# Patient Record
Sex: Male | Born: 1950 | Race: White | Hispanic: No | Marital: Single
Health system: Southern US, Community
[De-identification: ages and names within clinical notes are randomized; demographics above are authoritative.]

## PROBLEM LIST (undated history)

## (undated) DIAGNOSIS — E119 Type 2 diabetes mellitus without complications: Secondary | ICD-10-CM

## (undated) DIAGNOSIS — E785 Hyperlipidemia, unspecified: Secondary | ICD-10-CM

## (undated) DIAGNOSIS — F339 Major depressive disorder, recurrent, unspecified: Secondary | ICD-10-CM

## (undated) DIAGNOSIS — I340 Nonrheumatic mitral (valve) insufficiency: Secondary | ICD-10-CM

## (undated) DIAGNOSIS — F321 Major depressive disorder, single episode, moderate: Secondary | ICD-10-CM

## (undated) DIAGNOSIS — I10 Essential (primary) hypertension: Secondary | ICD-10-CM

## (undated) DIAGNOSIS — Z8669 Personal history of other diseases of the nervous system and sense organs: Secondary | ICD-10-CM

## (undated) DIAGNOSIS — R002 Palpitations: Secondary | ICD-10-CM

## (undated) DIAGNOSIS — I509 Heart failure, unspecified: Secondary | ICD-10-CM

## (undated) DIAGNOSIS — J45909 Unspecified asthma, uncomplicated: Secondary | ICD-10-CM

## (undated) DIAGNOSIS — I2510 Atherosclerotic heart disease of native coronary artery without angina pectoris: Secondary | ICD-10-CM

## (undated) HISTORY — PX: HEMORRHOID SURGERY: SHX153

## (undated) HISTORY — DX: Palpitations: R00.2

## (undated) HISTORY — DX: Essential (primary) hypertension: I10

## (undated) HISTORY — DX: Type 2 diabetes mellitus without complications: E11.9

## (undated) HISTORY — DX: Hyperlipidemia, unspecified: E78.5

## (undated) HISTORY — DX: Major depressive disorder, recurrent, unspecified: F33.9

## (undated) HISTORY — PX: CHOLECYSTECTOMY: SHX55

## (undated) HISTORY — DX: Personal history of other diseases of the nervous system and sense organs: Z86.69

## (undated) HISTORY — DX: Nonrheumatic mitral (valve) insufficiency: I34.0

## (undated) HISTORY — DX: Unspecified asthma, uncomplicated: J45.909

## (undated) HISTORY — DX: Atherosclerotic heart disease of native coronary artery without angina pectoris: I25.10

## (undated) HISTORY — PX: TONSILLECTOMY AND ADENOIDECTOMY: SHX28

## (undated) HISTORY — DX: Heart failure, unspecified: I50.9

## (undated) HISTORY — DX: Major depressive disorder, single episode, moderate: F32.1

## (undated) HISTORY — PX: GALLBLADDER SURGERY: SHX652

## (undated) HISTORY — PX: OTHER SURGICAL HISTORY: SHX169

---

## 2008-06-10 ENCOUNTER — Ambulatory Visit: Payer: Self-pay | Admitting: Nurse Practitioner

## 2009-02-24 ENCOUNTER — Emergency Department: Payer: Self-pay | Admitting: Emergency Medicine

## 2009-09-22 ENCOUNTER — Ambulatory Visit: Payer: Self-pay | Admitting: Adult Health

## 2009-09-24 ENCOUNTER — Emergency Department: Payer: Self-pay | Admitting: Emergency Medicine

## 2009-10-11 ENCOUNTER — Emergency Department: Payer: Self-pay | Admitting: Emergency Medicine

## 2009-12-03 ENCOUNTER — Emergency Department: Payer: Self-pay | Admitting: Emergency Medicine

## 2010-02-05 ENCOUNTER — Emergency Department: Payer: Self-pay | Admitting: Emergency Medicine

## 2010-12-25 ENCOUNTER — Emergency Department: Payer: Self-pay | Admitting: Emergency Medicine

## 2011-07-15 ENCOUNTER — Ambulatory Visit: Payer: Self-pay | Admitting: Cardiovascular Disease

## 2011-08-17 ENCOUNTER — Inpatient Hospital Stay: Payer: Self-pay | Admitting: Internal Medicine

## 2011-09-20 ENCOUNTER — Other Ambulatory Visit: Payer: Self-pay | Admitting: Physician Assistant

## 2011-10-14 ENCOUNTER — Emergency Department: Payer: Self-pay | Admitting: Emergency Medicine

## 2011-11-24 ENCOUNTER — Ambulatory Visit: Payer: Self-pay | Admitting: Gastroenterology

## 2012-01-04 IMAGING — CR DG ABDOMEN 1V
1 series · 4 of 4 positions shown · non-contrast
Comparison: none

REASON FOR EXAM: need lateral too stuck insulin needle into abd and broke
it off into skin  eval
COMMENTS:

[Series 1: w abdomen decub · 0.14mm/px · 4 of 4 slices shown]
[im 1/4]
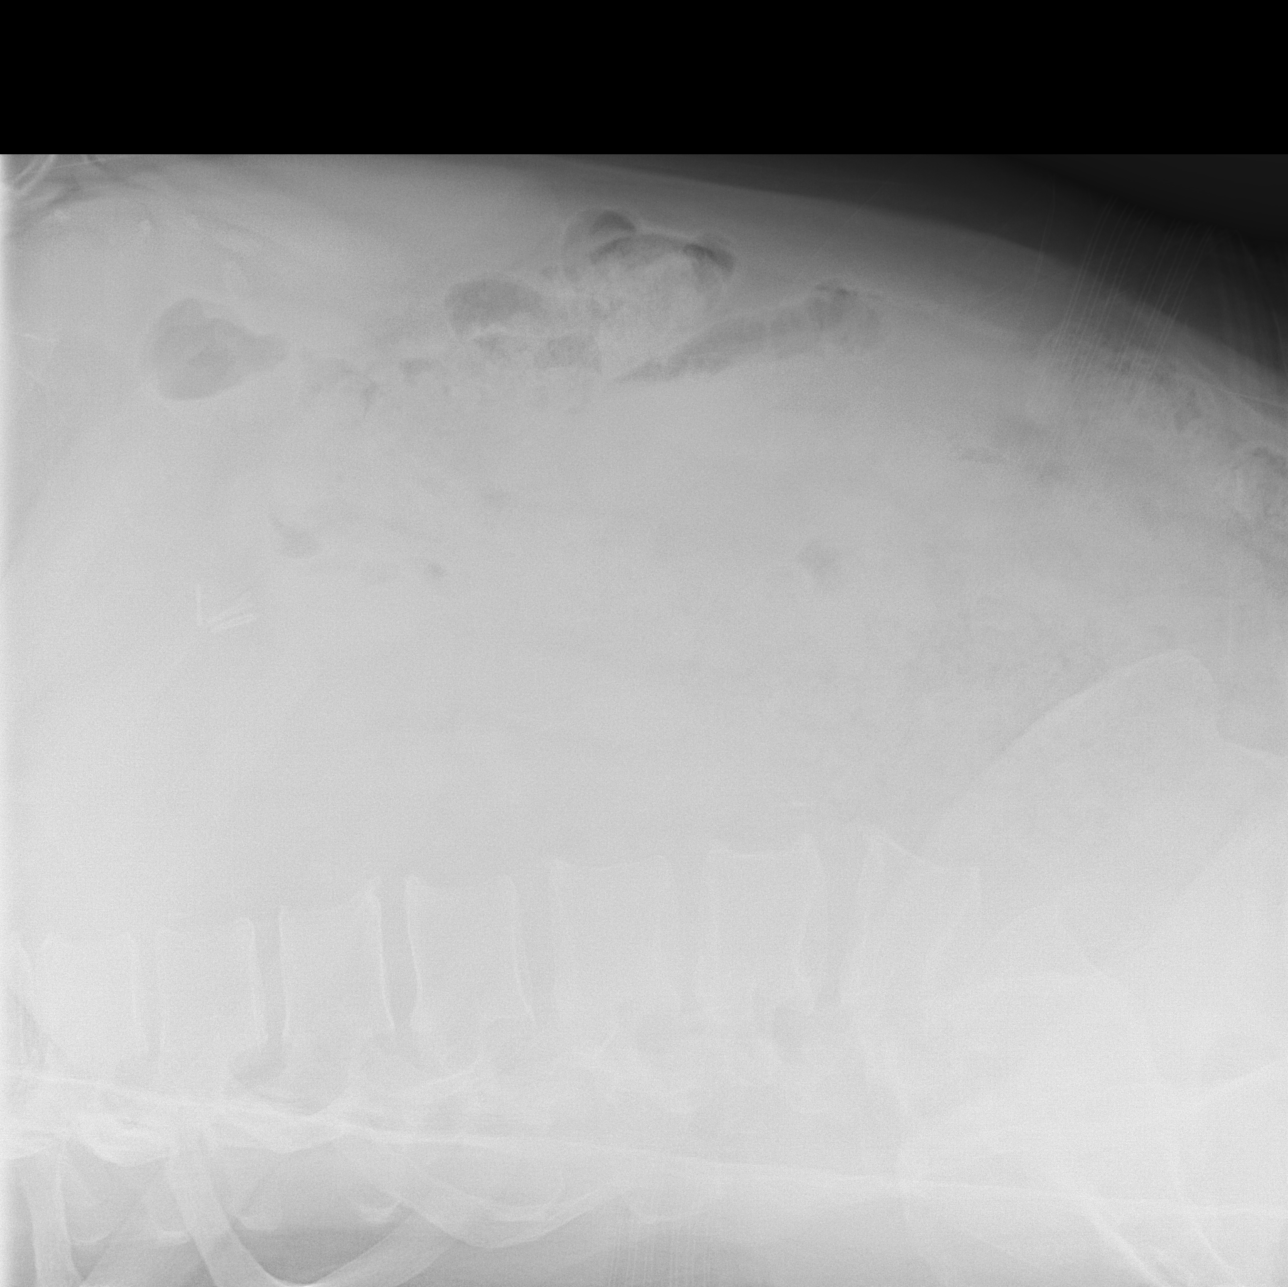
[im 2/4]
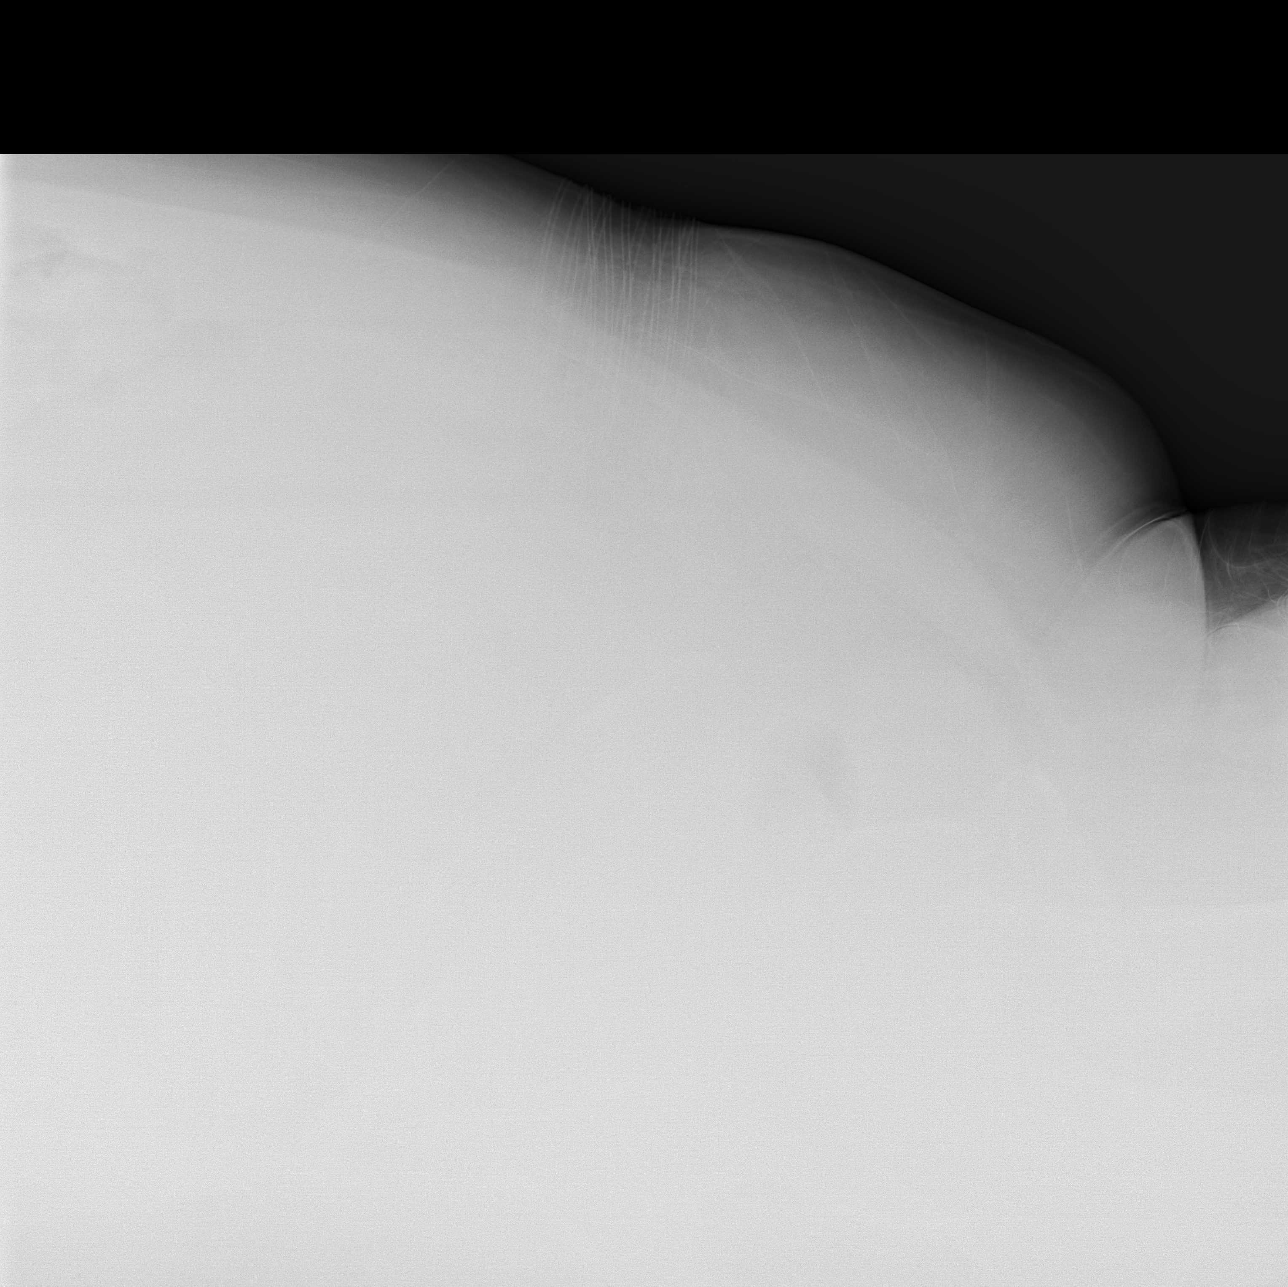
[im 3/4]
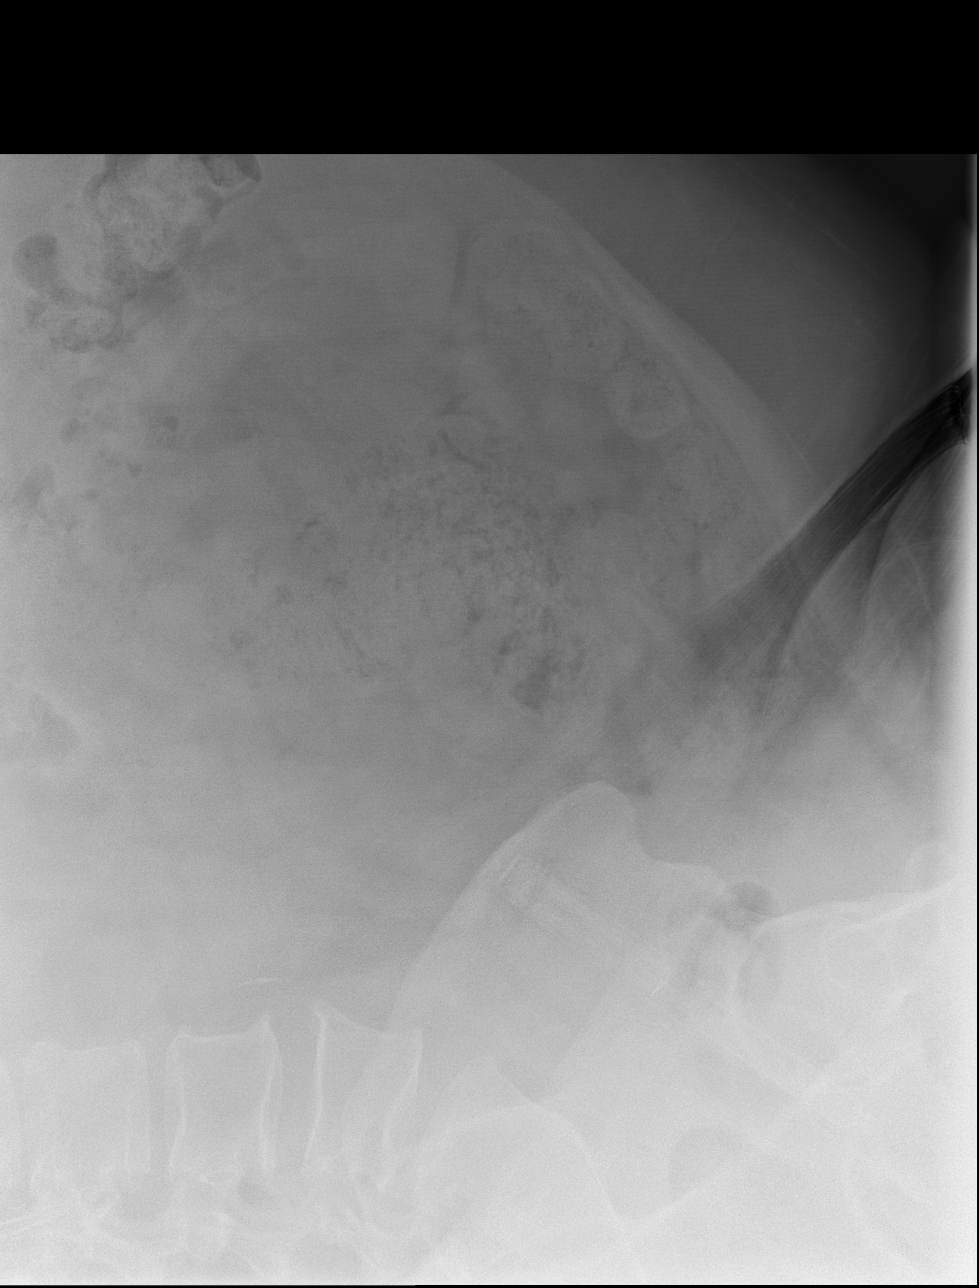
[im 4/4]
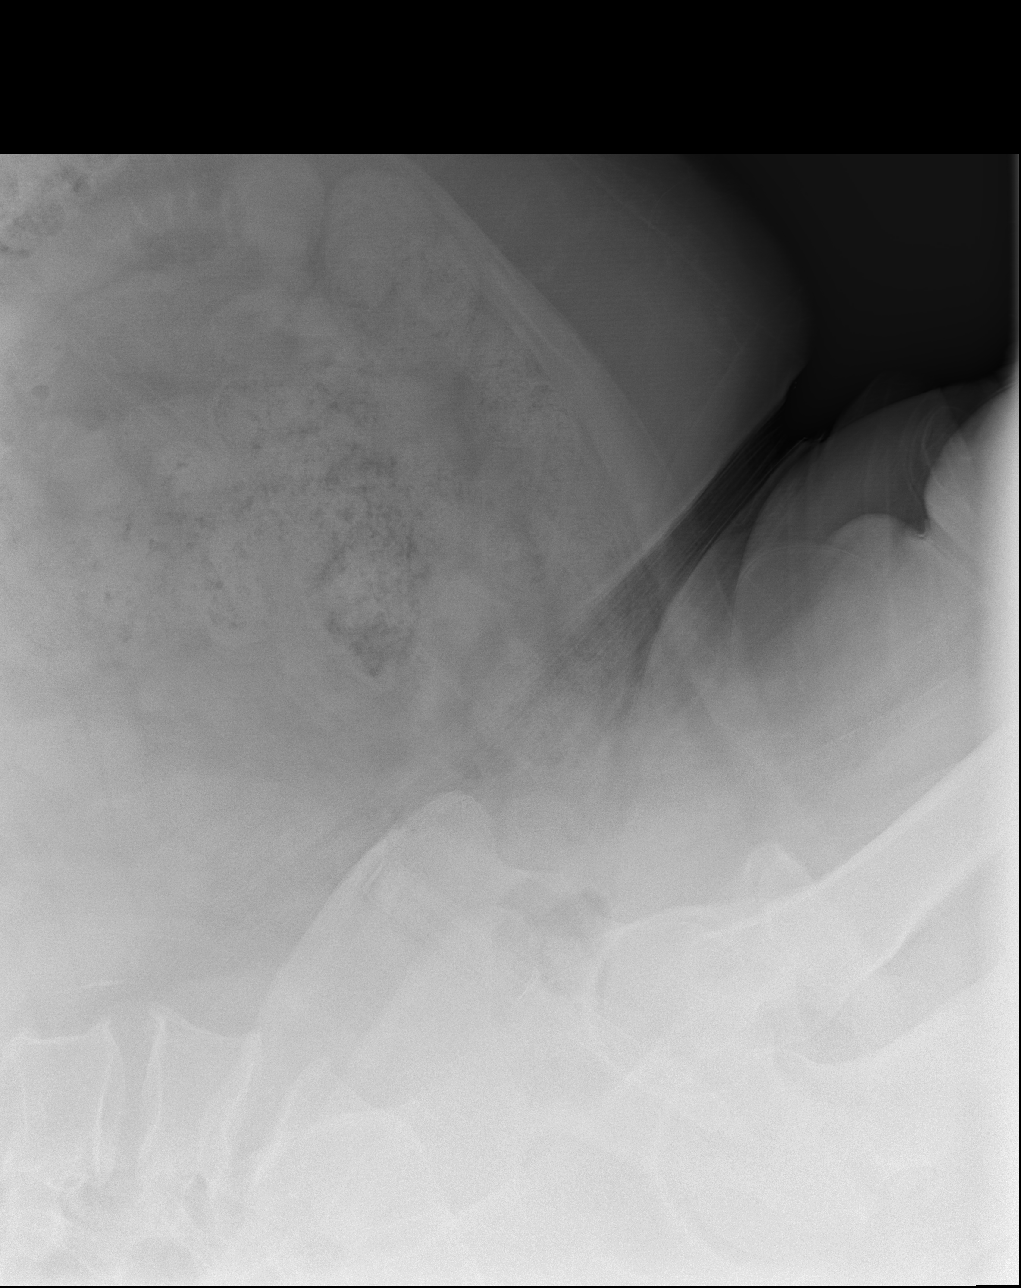

[4 of 4 positions shown; findings below may reference images not displayed]

PROCEDURE:     DXR - DXR ABDOMEN AP ONLY  - October 14, 2011  [DATE]

RESULT:     Crosstable lateral views with soft tissue technique were
obtained. No radiodense soft tissue foreign body is seen. Reportedly, a
portion of an insulin syringe needle broke but on the current exam no soft
tissue foreign body is identified.
IMPRESSION: No soft tissue foreign body is identified.

## 2012-03-29 ENCOUNTER — Ambulatory Visit: Payer: Self-pay | Admitting: Gastroenterology

## 2012-11-02 ENCOUNTER — Ambulatory Visit: Payer: Self-pay | Admitting: Ophthalmology

## 2012-11-02 DIAGNOSIS — I10 Essential (primary) hypertension: Secondary | ICD-10-CM

## 2012-11-02 LAB — CBC WITH DIFFERENTIAL/PLATELET
Basophil #: 0.1 x10 3/mm 3
Basophil %: 1.1 %
Eosinophil #: 0.3 x10 3/mm 3
Eosinophil %: 4.6 %
HCT: 41.6 %
HGB: 14.5 g/dL
Lymphocyte %: 21.8 %
Lymphs Abs: 1.5 x10 3/mm 3
MCH: 27.6 pg
MCHC: 34.8 g/dL
MCV: 79 fL — ABNORMAL LOW
Monocyte #: 0.5 "x10 3/mm "
Monocyte %: 6.7 %
Neutrophil #: 4.6 x10 3/mm 3
Neutrophil %: 65.8 %
Platelet: 187 x10 3/mm 3
RBC: 5.26 x10 6/mm 3
RDW: 14.7 % — ABNORMAL HIGH
WBC: 7 x10 3/mm 3

## 2012-11-02 LAB — BASIC METABOLIC PANEL
Anion Gap: 6 — ABNORMAL LOW (ref 7–16)
BUN: 17 mg/dL (ref 7–18)
Co2: 30 mmol/L (ref 21–32)
Creatinine: 0.93 mg/dL (ref 0.60–1.30)
EGFR (African American): >60
Osmolality: 285 (ref 275–301)

## 2012-11-15 ENCOUNTER — Ambulatory Visit: Payer: Self-pay | Admitting: Ophthalmology

## 2013-03-09 ENCOUNTER — Other Ambulatory Visit: Payer: Self-pay | Admitting: Ophthalmology

## 2013-03-09 LAB — POTASSIUM: Potassium: 4.6 mmol/L (ref 3.5–5.1)

## 2013-03-14 ENCOUNTER — Ambulatory Visit: Payer: Self-pay | Admitting: Ophthalmology

## 2013-03-29 ENCOUNTER — Ambulatory Visit: Payer: Self-pay | Admitting: Cardiovascular Disease

## 2014-03-27 ENCOUNTER — Ambulatory Visit: Payer: Self-pay | Admitting: Ophthalmology

## 2014-03-27 DIAGNOSIS — I2510 Atherosclerotic heart disease of native coronary artery without angina pectoris: Secondary | ICD-10-CM

## 2014-03-27 LAB — BASIC METABOLIC PANEL
Anion Gap: 5 — ABNORMAL LOW (ref 7–16)
BUN: 27 mg/dL — AB (ref 7–18)
CHLORIDE: 98 mmol/L (ref 98–107)
CO2: 30 mmol/L (ref 21–32)
Calcium, Total: 9.3 mg/dL (ref 8.5–10.1)
Creatinine: 1.53 mg/dL — ABNORMAL HIGH (ref 0.60–1.30)
EGFR (Non-African Amer.): 47 — ABNORMAL LOW
GFR CALC AF AMER: 55 — AB
GLUCOSE: 358 mg/dL — AB (ref 65–99)
Osmolality: 286 (ref 275–301)
POTASSIUM: 5.1 mmol/L (ref 3.5–5.1)
SODIUM: 133 mmol/L — AB (ref 136–145)

## 2014-03-27 LAB — CBC WITH DIFFERENTIAL/PLATELET
Basophil #: 0.1 10*3/uL (ref 0.0–0.1)
Basophil %: 0.7 %
EOS ABS: 0.2 10*3/uL (ref 0.0–0.7)
Eosinophil %: 1.4 %
HCT: 45.8 % (ref 40.0–52.0)
HGB: 15.1 g/dL (ref 13.0–18.0)
LYMPHS ABS: 1.9 10*3/uL (ref 1.0–3.6)
LYMPHS PCT: 17.2 %
MCH: 27.4 pg (ref 26.0–34.0)
MCHC: 32.9 g/dL (ref 32.0–36.0)
MCV: 83 fL (ref 80–100)
MONOS PCT: 7.1 %
Monocyte #: 0.8 x10 3/mm (ref 0.2–1.0)
NEUTROS ABS: 8.1 10*3/uL — AB (ref 1.4–6.5)
Neutrophil %: 73.6 %
Platelet: 183 10*3/uL (ref 150–440)
RBC: 5.5 10*6/uL (ref 4.40–5.90)
RDW: 15.7 % — ABNORMAL HIGH (ref 11.5–14.5)
WBC: 11 10*3/uL — ABNORMAL HIGH (ref 3.8–10.6)

## 2014-09-25 ENCOUNTER — Ambulatory Visit: Payer: Self-pay | Admitting: Ophthalmology

## 2014-09-25 LAB — BASIC METABOLIC PANEL
ANION GAP: 5 — AB (ref 7–16)
BUN: 22 mg/dL — AB (ref 7–18)
CALCIUM: 9 mg/dL (ref 8.5–10.1)
CO2: 29 mmol/L (ref 21–32)
CREATININE: 1.14 mg/dL (ref 0.60–1.30)
Chloride: 107 mmol/L (ref 98–107)
EGFR (African American): >60
EGFR (Non-African Amer.): >60
Glucose: 134 mg/dL — ABNORMAL HIGH (ref 65–99)
Osmolality: 287 (ref 275–301)
POTASSIUM: 4.6 mmol/L (ref 3.5–5.1)
Sodium: 141 mmol/L (ref 136–145)

## 2014-09-25 LAB — CBC WITH DIFFERENTIAL/PLATELET
Basophil #: 0.1 10*3/uL (ref 0.0–0.1)
Basophil %: 0.6 %
Eosinophil #: 0.1 10*3/uL (ref 0.0–0.7)
Eosinophil %: 1.4 %
HCT: 39.2 % — ABNORMAL LOW (ref 40.0–52.0)
HGB: 12.9 g/dL — ABNORMAL LOW (ref 13.0–18.0)
LYMPHS ABS: 2.1 10*3/uL (ref 1.0–3.6)
LYMPHS PCT: 22.2 %
MCH: 26.7 pg (ref 26.0–34.0)
MCHC: 32.8 g/dL (ref 32.0–36.0)
MCV: 81 fL (ref 80–100)
Monocyte #: 0.6 x10 3/mm (ref 0.2–1.0)
Monocyte %: 6.4 %
Neutrophil #: 6.5 10*3/uL (ref 1.4–6.5)
Neutrophil %: 69.4 %
Platelet: 197 10*3/uL (ref 150–440)
RBC: 4.82 10*6/uL (ref 4.40–5.90)
RDW: 15 % — ABNORMAL HIGH (ref 11.5–14.5)
WBC: 9.4 10*3/uL (ref 3.8–10.6)

## 2014-10-02 ENCOUNTER — Ambulatory Visit: Payer: Self-pay | Admitting: Ophthalmology

## 2015-04-01 DIAGNOSIS — F321 Major depressive disorder, single episode, moderate: Secondary | ICD-10-CM | POA: Insufficient documentation

## 2015-04-01 DIAGNOSIS — E785 Hyperlipidemia, unspecified: Secondary | ICD-10-CM | POA: Insufficient documentation

## 2015-04-01 DIAGNOSIS — F339 Major depressive disorder, recurrent, unspecified: Secondary | ICD-10-CM | POA: Insufficient documentation

## 2015-04-02 ENCOUNTER — Other Ambulatory Visit: Payer: Self-pay

## 2015-04-15 NOTE — Op Note (Signed)
PATIENT NAME:  Juan Nicholson, Juan Nicholson MR#:  [id number] DATE OF BIRTH:  [birth year]  DATE OF PROCEDURE:  11/15/2012  PROCEDURES PERFORMED:  1. Pars plana vitrectomy of the right eye.  2. Tractional retinal detachment repair of the right eye.  3. Endolaser, right eye.  4. Silicone oil injection, right eye.  5. Phacoemulsification intraocular lens insertion, right eye.   PREOPERATIVE DIAGNOSES:  1. Tractional retinal detachment.  2. Proliferative diabetic retinopathy.  3. Visually significant cataract.   POSTOPERATIVE DIAGNOSES: 1. Tractional retinal detachment.  2. Proliferative diabetic retinopathy.  3. Visually significant cataract.   ESTIMATED BLOOD LOSS: Less than 1 mL.  PRIMARY SURGEON: Ignacia Felling. Lavaughn Haberle, MD   ANESTHESIA: General endotracheal anesthesia with a supplemental retrobulbar block of the right eye.   COMPLICATIONS: None.   INDICATIONS FOR PROCEDURE: This is a patient who presented to my office with loss of vision in his right eye over several months. Examination revealed proliferative diabetic retinopathy that was associated with a tractional retinal detachment that encompassed approximately 200 to 240 degrees of the retina out to the equator. This also included a retinal detachment of the macula. Risks, benefits, and alternatives of the above procedure were discussed and the patient wished to proceed.   DETAILS OF PROCEDURE: After informed consent was obtained, the patient was brought into the operative suite at [facility]. The patient was placed in supine position, was induced by the anesthesia team without any complications. The right eye was then blocked with a retrobulbar block by the primary surgeon without any complications. The patient was positioned and right eye was prepped and draped in sterile manner. After lid speculum was inserted, a side-port wound was created at approximately 10:30 in the cornea in a clear corneal fashion. DisCoVisc was  injected into the anterior chamber in order to maintain it. Main corneal wound was created at 12 o'clock with a keratome blade. Cystotome was introduced in the eye and a continuous 360 degree anterior capsulorrhexis was created without complication. The lens was hydrodissected using BSS and rotated for 90 degrees. Phacoemulsification wand was introduced in the eye and the lens was broken into four quadrants and removed without complication. The cortical material was removed using INA. DisCoVisc was injected into the capsular bag. A 22.5-diopter Tecnis ZCB00 lens, serial #[id number], was injected into the capsular bag and rotated into position. The DisCoVisc was removed using INA. A 10-0 stitch was placed in the main corneal wound and the knot was rotated into the cornea. The side-port wound was hydrated using BSS and the wounds were noted to be watertight. Attention was turned to the pars plana vitrectomy portion of the case.   A 23-gauge trocar was then placed inferotemporally through displaced conjunctiva in an oblique fashion 3 mm beyond the limbus. The infusion cannula was turned on and inserted through the trocar and secured in position with Steri-Strips. Two more trocars were placed in a similar fashion superotemporally and superonasally. The vitreous cutter and light pipe were introduced into the eye and a core vitrectomy was performed. A series of forceps, vitreous cutter, and vertical scissors with pick were utilized in order to carefully dissect each of the proliferative membranes away from each other. Once this was completed, the membranes were removed as much as safely possible. The peripheral vitreous was then peeled away from the retina with no resultant breaks at any point. The retina was confirmed to be truly detached for approximately 250 degrees mostly nasal. The vitreous trimmed out to the  ora serrata for 360 degrees. A posterior draining retinotomy was created at approximately 2:30. An  air-fluid exchange was performed through this draining retinotomy and the retina noted flatten up to the arcades. Endolaser was introduced. Complete panretinal photocoagulation was taken from the arcades out to the ora serrata. The soft tip backflush was reintroduced and remnant fluid was removed from the posterior of the eye and removed through the draining retinotomy. Five rows of laser was placed around the draining retinotomy without complication. A small inferior PI was created using the vitreous cutter. 5000 centistoke silicone oil was then injected into the vitreous cavity up to the level of the posterior chamber IOL. Pressure in the eye was approximately 10 mmHg and the trocars were removed. The wounds were closed using transconjunctival 6-0 plain gut. 5 mg of dexamethasone was given into the inferior fornix. The eye was then cleaned and Cosopt and TobraDex were placed on the eye. A patch and shield was placed over the eye and the patient was taken to postanesthesia care with instructions to remain right side down.   ____________________________ Ignacia FellingMatthew F. Champ MungoAppenzeller, MD mfa:drc D: 11/15/2012 09:50:00 ET T: 11/15/2012 12:18:56 ET JOB#: [id number]  cc: Ignacia FellingMatthew F. Champ MungoAppenzeller, MD, <Dictator> Cline CoolsMATTHEW F Demiyah Fischbach MD ELECTRONICALLY SIGNED 11/29/2012 6:55

## 2015-04-18 NOTE — Op Note (Signed)
PATIENT NAME:  Juan Nicholson, Juan Nicholson MR#:  [id number] DATE OF BIRTH:  Sep 24, 1951  DATE OF PROCEDURE:  03/14/2013  PROCEDURES PERFORMED: 1.  Pars plana vitrectomy of the right eye.  2.  Silicone oil removal of the right eye.   PREOPERATIVE DIAGNOSES: Retained silicone oil of the right eye.   POSTOPERATIVE DIAGNOSES: Retained silicone oil of the right eye.   ESTIMATED BLOOD LOSS: Less than 1 mL.   PRIMARY SURGEON: Aron BabaMatthew Dexter Signor, M.D.   ANESTHESIA: Retrobulbar block of the right eye, with monitored anesthesia care.   COMPLICATIONS: None.   INDICATIONS FOR PROCEDURE: This is a patient who had undergone a significant tractional retinal detachment repair secondary to severe proliferative diabetic retinopathy requiring silicone oil injection. The patient went through an uncomplicated postoperative period, and retained silicone oil in both the vitreous chamber and the anterior chamber. Risks, benefits and alternatives of the above procedure were discussed and the patient wished to proceed.   DETAILS: After informed consent was the patient was brought to the operating suite at the  [facility]. The patient was placed in supine position and was given a small dose of propofol and a retrobulbar block was performed on the right eye by the primary surgeon without any complications. The right eye was prepped and draped in sterile manner. After a lid speculum was inserted, a 23-gauge trocar was placed inferotemporally through displaced conjunctiva in an oblique fashion 3 mm behind the limbus. Infusion cannula was turned on and inserted through the trocar and secured in position with Steri-Strips. An L-shaped conjunctival peritomy was created superotemporally. A sclerotomy blade was used to create a sclerotomy 3 mm beyond the limbus. A silicone oil extractor was inserted through the sclerotomy and was used to remove the silicone oil without any complications.   A clear corneal wound was  created at approximately 10:30, and the silicone oil in the anterior chamber was removed via this wound. This clear corneal wound was closed using 10-0 plain gut.   The light pipe was introduced and the retina was investigated. The retina was completely attached, with concern for some glaucomatous optic nerve change. The sclerotomy was then closed using 7-0 Vicryl. The conjunctival peritomy was closed using 6-0 plain gut. The infusion cannula was removed and this wound was closed using a transconjunctival 6-0 plain gut. The eye was pressurized with BSS to approximately 15 mmHg. All the wounds were noted to be watertight and the eye was maintaining pressure; 5 mg of dexamethasone was given into the inferior fornix and the lid speculum was removed. The eye was cleaned and Cosopt, followed by TobraDex, was placed onto the eye. A patch and shield were placed over the eye and the patient was taken postanesthesia care with instructions to remain head-up.    ____________________________ Ignacia FellingMatthew F. Champ MungoAppenzeller, MD mfa:dm D: 03/14/2013 08:01:00 ET T: 03/14/2013 08:10:17 ET JOB#: [id number]  cc: Ignacia FellingMatthew F. Champ MungoAppenzeller, MD, <Dictator> Cline CoolsMATTHEW F Kebin Maye MD ELECTRONICALLY SIGNED 03/14/2013 9:27

## 2015-04-19 NOTE — Op Note (Signed)
PATIENT NAME:  [name], Gordon J MR#:  [id number] DATE OF BIRTH:  10-17-1951  DATE OF PROCEDURE:  10/02/2014  PREOPERATIVE DIAGNOSIS: Epiretinal membrane with lamellar macular hole, right eye.  POSTOPERATIVE DIAGNOSIS: Epiretinal membrane with lamellar macular hole, right eye.  PROCEDURE PERFORMED: A 25 gauge pars plana vitrectomy with ICG (indocyanine green) assisted membrane peel and partial air/fluid exchange, right eye.  SURGEON: Princella PellegriniJessica D. Duke Salviaandolph, MD  ANESTHESIA: General.  BLOOD LOSS: Minimal.  COMPLICATIONS: None.   PROCEDURE NOTE: Mr. Juan Nicholson is evaluated in the clinic for his epiretinal membrane on the right eye with a lamellar macular hole. The risks and benefits and alternatives and complications were discussed with the patient, as well as the guarded prognosis of his potential vision given his previous surgeries and marked diabetic retinopathy. He elected to proceed with vitrectomy surgery with membrane peel.   On the day of surgery, the patient s greeted in the preoperative holding area. The right eye was confirmed and marked, and any questions were answered. The patient was then taken to the operating room in supine position and placed under general anesthesia. The right eye was prepped and draped in the usual sterile fashion.   Trocars, 25 gauge, were placed in the usual positions and the infusion trocar was checked to ensure it was in the vitreous cavity prior to starting the infusion. A limited core vitrectomy was performed as the patient had previous vitrectomies in which most of the gel had already been removed. ICG was applied to the retinal surface and, using a contact macular lens, attention was drawn to the epiretinal membrane. ILM forceps were used to partially peel the membrane, but visualization was poor and so attention was drawn to the anterior chamber where a posterior capsulotomy was performed and iris hooks were placed to maximize visualization. Then attention was  drawn back to the epiretinal membrane, which was removed in its entirety. A scleral depressor was used to inspect the periphery for 360 degrees, but there were no tears or retinal detachments noted. A partial air-fluid exchange was performed and the patient was left with a partial air bubble in the eye.   The trocars were then removed and sutured. A peribulbar block was administered and subconjunctival Ancef and dexamethasone were placed.   The patient was then patched and shielded with Neo-Poly-Dex ointment and taken to the recovery area in stable condition.    ____________________________ Princella PellegriniJessica D. Duke Salviaandolph, MD jdr:MT D: 10/02/2014 09:06:54 ET T: 10/02/2014 10:10:24 ET JOB#: [id number]  cc: Shanda BumpsJessica D. Duke Salviaandolph, MD, <Dictator> Dartha LodgeJESSICA D Barrett MD ELECTRONICALLY SIGNED 10/09/2014 12:45

## 2015-06-17 ENCOUNTER — Ambulatory Visit: Payer: Medicare Other | Admitting: Psychiatry

## 2015-07-15 ENCOUNTER — Ambulatory Visit: Payer: Medicare Other | Admitting: Psychiatry

## 2015-11-17 ENCOUNTER — Ambulatory Visit: Payer: Medicare Other | Admitting: Psychiatry
